# Patient Record
Sex: Female | Born: 1950 | Race: White | Hispanic: No | Smoking: Never smoker
Health system: Southern US, Community
[De-identification: ages and names within clinical notes are randomized; demographics above are authoritative.]

## PROBLEM LIST (undated history)

## (undated) DIAGNOSIS — C189 Malignant neoplasm of colon, unspecified: Secondary | ICD-10-CM

## (undated) DIAGNOSIS — I10 Essential (primary) hypertension: Secondary | ICD-10-CM

## (undated) DIAGNOSIS — T8859XA Other complications of anesthesia, initial encounter: Secondary | ICD-10-CM

## (undated) DIAGNOSIS — E119 Type 2 diabetes mellitus without complications: Secondary | ICD-10-CM

## (undated) DIAGNOSIS — R112 Nausea with vomiting, unspecified: Secondary | ICD-10-CM

## (undated) HISTORY — PX: APPENDECTOMY: SHX54

## (undated) HISTORY — PX: TOTAL HIP REVISION: SHX763

---

## 2014-11-21 ENCOUNTER — Encounter: Payer: Self-pay | Admitting: Emergency Medicine

## 2014-11-21 ENCOUNTER — Ambulatory Visit
Admission: EM | Admit: 2014-11-21 | Discharge: 2014-11-21 | Disposition: A | Discharge status: Home / Self Care | Payer: BC Managed Care – PPO | Attending: Family Medicine | Admitting: Family Medicine

## 2014-11-21 DIAGNOSIS — L03119 Cellulitis of unspecified part of limb: Secondary | ICD-10-CM

## 2014-11-21 HISTORY — DX: Type 2 diabetes mellitus without complications: E11.9

## 2014-11-21 HISTORY — DX: Malignant neoplasm of colon, unspecified: C18.9

## 2014-11-21 HISTORY — DX: Essential (primary) hypertension: I10

## 2014-11-21 MED ORDER — MUPIROCIN 2 % EX OINT: 1.0000 "application " | TOPICAL_OINTMENT | Freq: Three times a day (TID) | CUTANEOUS | Status: DC

## 2014-11-21 MED ORDER — SULFAMETHOXAZOLE-TRIMETHOPRIM 800-160 MG PO TABS: 1.0000 | ORAL_TABLET | Freq: Two times a day (BID) | ORAL | Status: DC

## 2014-11-21 NOTE — ED Notes (Signed)
Patient c/o possible insect bite to the top of her right foot yesterday.  Patient reports redness and tenderness at the site.  Patient also has a red streak going up her right foot and ankle. Patient denies fevers.

## 2014-11-21 NOTE — ED Provider Notes (Signed)
CSN: [id number]     Arrival date & time 11/21/14  1250 History   First MD Initiated Contact with Patient 11/21/14 1321     Chief Complaint  Patient presents with  . Cellulitis  . Insect Bite   (Consider location/radiation/quality/duration/timing/severity/associated sxs/prior Treatment) HPI   This a 64 year old female who presents with a bug bite on her right foot dorsum ;is  proximal to the fourth toe. She states she was out in her garden yesterday morning he felt a bite but did not actually see the bug. She rubbed her foot with a her other shoe. Later that night she felt flulike aches and chills and sweats and this morning noticed more swelling in the foot and also a red streak running up the dorsum of her foot to the base of her leg. She's not any fever or chills and is afebrile today in the clinic. She does have some clear drainage from the wound on top of her foot but no purulence is present. There is some erythema in this does extend up her leg.   Past Medical History  Diagnosis Date  . Diabetes mellitus without complication   . Hypertension   . Colon cancer    Past Surgical History  Procedure Laterality Date  . Cesarean section    . Appendectomy     History reviewed. No pertinent family history. Social History  Substance Use Topics  . Smoking status: Never Smoker   . Smokeless tobacco: Never Used  . Alcohol Use: No   OB History    No data available     Review of Systems  Constitutional: Positive for fatigue.  Skin: Positive for color change and wound.  All other systems reviewed and are negative.   Allergies  Latex; Lovenox; and Penicillins  Home Medications   Prior to Admission medications   Medication Sig Start Date End Date Taking? Authorizing Provider  aspirin 81 MG tablet Take 81 mg by mouth daily.   Yes Historical Provider, MD  glipiZIDE (GLUCOTROL XL) 5 MG 24 hr tablet Take 5 mg by mouth daily with breakfast.   Yes Historical Provider, MD   hydrochlorothiazide (HYDRODIURIL) 50 MG tablet Take 50 mg by mouth daily.   Yes Historical Provider, MD  metFORMIN (GLUCOPHAGE) 500 MG tablet Take 500 mg by mouth 2 (two) times daily with a meal.   Yes Historical Provider, MD  Multiple Vitamin (MULTIVITAMIN) tablet Take 1 tablet by mouth daily.   Yes Historical Provider, MD  omega-3 acid ethyl esters (LOVAZA) 1 G capsule Take 1 g by mouth 2 (two) times daily.   Yes Historical Provider, MD  telmisartan (MICARDIS) 40 MG tablet Take 40 mg by mouth daily.   Yes Historical Provider, MD  mupirocin ointment (BACTROBAN) 2 % Apply 1 application topically 3 (three) times daily. 11/21/14   Lorin Picket, PA-C  sulfamethoxazole-trimethoprim (BACTRIM DS,SEPTRA DS) 800-160 MG per tablet Take 1 tablet by mouth 2 (two) times daily. 11/21/14   Lorin Picket, PA-C   Meds Ordered and Administered this Visit  Medications - No data to display  BP 123/56 mmHg  Pulse 74  Temp(Src) 96.9 F (36.1 C) (Tympanic)  Resp 16  Ht 5\' 6"  (1.676 m)  Wt 248 lb (112.492 kg)  BMI 40.05 kg/m2  SpO2 98% No data found.   Physical Exam  Constitutional: She is oriented to person, place, and time. She appears well-developed and well-nourished.  HENT:  Head: Normocephalic and atraumatic.  Eyes: Pupils are equal, round, and  reactive to light.  Musculoskeletal: Normal range of motion. She exhibits edema and tenderness.  Neurological: She is alert and oriented to person, place, and time.  Skin: Skin is warm and dry. Rash noted. There is erythema.  Examination of the right foot shows a small ulceration over the dorsum of the foot distally at the base of the fourth toe. It is leaking clear fluid. There is surrounding erythema; extends proximally over the dorsum of her foot medially and up to the base of her leg near the ankle. It is blanchable. No purulence is seen. She has good range of motion of her toes and of her ankle.  Psychiatric: She has a normal mood and affect. Her  behavior is normal. Judgment and thought content normal.  Nursing note and vitals reviewed.   ED Course  Procedures (including critical care time)  Labs Review Labs Reviewed - No data to display  Imaging Review No results found.   Visual Acuity Review  Right Eye Distance:   Left Eye Distance:   Bilateral Distance:    Right Eye Near:   Left Eye Near:    Bilateral Near:         MDM   1. Cellulitis of foot    New Prescriptions   MUPIROCIN OINTMENT (BACTROBAN) 2 %    Apply 1 application topically 3 (three) times daily.   SULFAMETHOXAZOLE-TRIMETHOPRIM (BACTRIM DS,SEPTRA DS) 800-160 MG PER TABLET    Take 1 tablet by mouth 2 (two) times daily.  Plan: 1. Diagnosis reviewed with patient 2. rx as per orders; risks, benefits, potential side effects reviewed with patient 3. Recommend supportive treatment with elevation,warm compresses or soaks. 4. F/u prn if symptoms worsen or don't improve    Lorin Picket, PA-C 11/21/14 1410

## 2015-11-26 ENCOUNTER — Ambulatory Visit
Admission: EM | Admit: 2015-11-26 | Discharge: 2015-11-26 | Disposition: A | Discharge status: Home / Self Care | Payer: BC Managed Care – PPO | Attending: Family Medicine | Admitting: Family Medicine

## 2015-11-26 DIAGNOSIS — W57XXXA Bitten or stung by nonvenomous insect and other nonvenomous arthropods, initial encounter: Secondary | ICD-10-CM | POA: Diagnosis not present

## 2015-11-26 DIAGNOSIS — L03114 Cellulitis of left upper limb: Secondary | ICD-10-CM

## 2015-11-26 DIAGNOSIS — T148 Other injury of unspecified body region: Secondary | ICD-10-CM

## 2015-11-26 DIAGNOSIS — L089 Local infection of the skin and subcutaneous tissue, unspecified: Secondary | ICD-10-CM

## 2015-11-26 MED ORDER — DIPHENHYDRAMINE HCL 25 MG PO CAPS: 25.0000 mg | ORAL_CAPSULE | Freq: Three times a day (TID) | ORAL | 0 refills | Status: AC | PRN

## 2015-11-26 MED ORDER — ACETAMINOPHEN 500 MG PO TABS: 1000.0000 mg | ORAL_TABLET | Freq: Four times a day (QID) | ORAL | 0 refills | Status: AC | PRN

## 2015-11-26 MED ORDER — CEPHALEXIN 500 MG PO CAPS: 500.0000 mg | ORAL_CAPSULE | Freq: Four times a day (QID) | ORAL | 0 refills | Status: DC

## 2015-11-26 NOTE — ED Triage Notes (Signed)
Patient was working in her flower bed and was bit by a small bug and it has left a small bite mark and its swollen, red, and a red streak up her arm.

## 2015-11-26 NOTE — ED Provider Notes (Signed)
CSN: [id number]     Arrival date & time 11/26/15  1109 History   First MD Initiated Contact with Patient 11/26/15 1226     Chief Complaint  Patient presents with  . Insect Bite    Left Hand and Arm   (Consider location/radiation/quality/duration/timing/severity/associated sxs/prior Treatment) Single caucasian female here for possible ant bite.  Was gardening and got bitten on lateral left hand.  Swollen up and red despite benadryl and rest overnight.  Similar episode last year on her foot except that was oozing.  Denied tick bite/attachment.  Blood sugars her usual 110.  Denied fever.  Hand tender to touch swollen area and itching.  Noted streak of redness going up forearm left today.      Past Medical History:  Diagnosis Date  . Colon cancer ([place])   . Diabetes mellitus without complication ([place])   . Hypertension    Past Surgical History:  Procedure Laterality Date  . APPENDECTOMY    . CESAREAN SECTION    . TOTAL HIP REVISION     Right   History reviewed. No pertinent family history. Social History  Substance Use Topics  . Smoking status: Never Smoker  . Smokeless tobacco: Never Used  . Alcohol use No   OB History    No data available     Review of Systems  Constitutional: Negative for activity change, appetite change, chills, diaphoresis, fatigue and fever.  HENT: Negative for congestion, ear pain, sore throat, trouble swallowing and voice change.   Eyes: Negative for pain, discharge, itching and visual disturbance.  Respiratory: Negative for cough, shortness of breath, wheezing and stridor.   Cardiovascular: Negative for chest pain, palpitations and leg swelling.  Gastrointestinal: Negative for abdominal distention, abdominal pain, blood in stool, nausea and vomiting.  Endocrine: Negative for polydipsia, polyphagia and polyuria.  Genitourinary: Negative for difficulty urinating, dysuria and hematuria.  Musculoskeletal: Positive for myalgias. Negative for arthralgias,  back pain, gait problem, joint swelling, neck pain and neck stiffness.  Skin: Positive for color change and rash. Negative for pallor and wound.  Allergic/Immunologic: Negative for environmental allergies and food allergies.  Neurological: Negative for dizziness, tremors, seizures, syncope, facial asymmetry, speech difficulty, weakness, light-headedness, numbness and headaches.  Hematological: Negative for adenopathy. Does not bruise/bleed easily.  Psychiatric/Behavioral: Negative for sleep disturbance.  All other systems reviewed and are negative.   Allergies  Latex; Lovenox [enoxaparin]; and Penicillins  Home Medications   Prior to Admission medications   Medication Sig Start Date End Date Taking? Authorizing Provider  aspirin 81 MG tablet Take 81 mg by mouth daily.   Yes Historical Provider, MD  glipiZIDE (GLUCOTROL XL) 5 MG 24 hr tablet Take 5 mg by mouth daily with breakfast.   Yes Historical Provider, MD  hydrochlorothiazide (HYDRODIURIL) 50 MG tablet Take 50 mg by mouth daily.   Yes Historical Provider, MD  metFORMIN (GLUCOPHAGE) 500 MG tablet Take 500 mg by mouth 2 (two) times daily with a meal.   Yes Historical Provider, MD  Multiple Vitamin (MULTIVITAMIN) tablet Take 1 tablet by mouth daily.   Yes Historical Provider, MD  omega-3 acid ethyl esters (LOVAZA) 1 G capsule Take 1 g by mouth 2 (two) times daily.   Yes Historical Provider, MD  telmisartan (MICARDIS) 40 MG tablet Take 40 mg by mouth daily.   Yes Historical Provider, MD  acetaminophen (TYLENOL) 500 MG tablet Take 2 tablets (1,000 mg total) by mouth every 6 (six) hours as needed for mild pain or moderate pain. 11/26/15  11/29/15  Olen Cordial, NP  cephALEXin (KEFLEX) 500 MG capsule Take 1 capsule (500 mg total) by mouth 4 (four) times daily. 11/26/15   Olen Cordial, NP  diphenhydrAMINE (BENADRYL) 25 mg capsule Take 1 capsule (25 mg total) by mouth every 8 (eight) hours as needed for itching. 11/26/15   Olen Cordial, NP   mupirocin ointment (BACTROBAN) 2 % Apply 1 application topically 3 (three) times daily. 11/21/14   Lorin Picket, PA-C  sulfamethoxazole-trimethoprim (BACTRIM DS,SEPTRA DS) 800-160 MG per tablet Take 1 tablet by mouth 2 (two) times daily. 11/21/14   Lorin Picket, PA-C   Meds Ordered and Administered this Visit  Medications - No data to display  BP (!) 148/64 (BP Location: Right Arm)   Pulse (!) 57   Temp 98.1 F (36.7 C) (Oral)   Resp 18   Ht 5\' 6"  (1.676 m)   Wt 243 lb (110.2 kg)   SpO2 97%   BMI 39.22 kg/m  No data found.   Physical Exam  Constitutional: She is oriented to person, place, and time. She appears well-developed and well-nourished. She is active and cooperative.  Non-toxic appearance. She does not have a sickly appearance. She does not appear ill. No distress.  HENT:  Head: Normocephalic and atraumatic.  Right Ear: Hearing, external ear and ear canal normal.  Left Ear: Hearing, external ear and ear canal normal.  Nose: Nose normal.  Mouth/Throat: Uvula is midline, oropharynx is clear and moist and mucous membranes are normal. She does not have dentures. No oral lesions. No trismus in the jaw. Normal dentition. No dental abscesses, uvula swelling, lacerations or dental caries. No oropharyngeal exudate, posterior oropharyngeal edema, posterior oropharyngeal erythema or tonsillar abscesses.  Eyes: Conjunctivae, EOM and lids are normal. Pupils are equal, round, and reactive to light. Right eye exhibits no chemosis, no discharge, no exudate and no hordeolum. No foreign body present in the right eye. Left eye exhibits no chemosis, no discharge, no exudate and no hordeolum. No foreign body present in the left eye. Right conjunctiva is not injected. Right conjunctiva has no hemorrhage. Left conjunctiva is not injected. Left conjunctiva has no hemorrhage. No scleral icterus. Right eye exhibits normal extraocular motion and no nystagmus. Left eye exhibits normal extraocular  motion and no nystagmus.  Neck: Neck supple. No tracheal tenderness, no spinous process tenderness and no muscular tenderness present. No neck rigidity. No tracheal deviation, no edema, no erythema and normal range of motion present.  Cardiovascular: Normal rate, regular rhythm, S1 normal, S2 normal, normal heart sounds and intact distal pulses.   No murmur heard. Pulses:      Radial pulses are 2+ on the right side, and 2+ on the left side.  Pulmonary/Chest: Effort normal and breath sounds normal. No stridor. No respiratory distress. She has no decreased breath sounds. She has no wheezes. She has no rhonchi. She has no rales. She exhibits no tenderness.  Abdominal: Soft. She exhibits no distension. There is no tenderness. There is no guarding.  Musculoskeletal: Normal range of motion. She exhibits edema and tenderness. She exhibits no deformity.       Right shoulder: Normal.       Left shoulder: Normal.       Left elbow: Normal.       Left wrist: Normal.       Right hip: Normal.       Left hip: Normal.       Right knee: Normal.  Left knee: Normal.       Right ankle: Normal.       Left ankle: Normal.       Cervical back: Normal.       Thoracic back: Normal.       Lumbar back: Normal.       Right upper arm: Normal.       Left upper arm: Normal.       Right forearm: Normal.       Left forearm: She exhibits tenderness and swelling. She exhibits no bony tenderness, no edema, no deformity and no laceration.       Arms:      Right hand: Normal.       Left hand: She exhibits tenderness and swelling. She exhibits normal range of motion, no bony tenderness, normal two-point discrimination, normal capillary refill, no deformity and no laceration. Normal sensation noted. Normal strength noted.       Hands: Lymphadenopathy:       Head (right side): No submental, no submandibular, no tonsillar, no preauricular, no posterior auricular and no occipital adenopathy present.       Head (left side):  No submental, no submandibular, no tonsillar, no preauricular and no occipital adenopathy present.    She has no cervical adenopathy.       Right cervical: No superficial cervical, no deep cervical and no posterior cervical adenopathy present.      Left cervical: No superficial cervical, no deep cervical and no posterior cervical adenopathy present.    She has no axillary adenopathy.       Right axillary: No pectoral and no lateral adenopathy present.       Left axillary: No pectoral and no lateral adenopathy present. Neurological: She is alert and oriented to person, place, and time. She has normal strength. She is not disoriented. She displays no atrophy and no tremor. No cranial nerve deficit or sensory deficit. She exhibits normal muscle tone. She displays no seizure activity. Coordination and gait normal. GCS eye subscore is 4. GCS verbal subscore is 5. GCS motor subscore is 6.  Skin: Skin is warm and dry. Capillary refill takes less than 2 seconds. Rash noted. No abrasion, no bruising, no burn, no ecchymosis, no laceration, no lesion, no petechiae and no purpura noted. Rash is macular. Rash is not papular, not maculopapular, not nodular, not pustular, not vesicular and not urticarial. She is not diaphoretic. There is erythema. No cyanosis. No pallor. Nails show no clubbing.  Psychiatric: She has a normal mood and affect. Her speech is normal and behavior is normal. Judgment and thought content normal. She is not actively hallucinating. Cognition and memory are normal. She is attentive.  Nursing note and vitals reviewed.   Urgent Care Course   Clinical Course    Procedures (including critical care time)  Labs Review Labs Reviewed - No data to display  Imaging Review No results found.     MDM   1. Cellulitis of left upper extremity   2. Infected insect bite    Will treat for cellulitis possible bug bite to right hand.  Keflex 500mg  po TID x 7 days Rx given.  Discussed with  patient cephalosporins could have cross reactivity for penicillin allergy rash arm as child.  Contact clinic if new or worsening rash/dyspnea.  Exitcare handout on skin infection, insect bite given to patient.  RTC if worsening erythema, pain, purulent discharge, fever. cryotherapy 15 minutes TID/elevate hand/arm.  ER if worsening redness to shoulder may  require IV antibiotics.  Patient may take tylenol 1000mg  po QID prn pain.  Avoid scratching may use benadryl 25mg  po TID prn itching and/or topical benadryl e.g. Spray/gel.  Has bactroban ointment at home from foot infection last year may apply BID if open wound develops until healed.  Wash towels, washcloths, sheets in hot water with bleach every couple of days until infection resolved.  Patient verbalized understanding, agreed with plan of care and had no further questions at this time.      Olen Cordial, NP 11/26/15 [phone]

## 2017-01-30 ENCOUNTER — Other Ambulatory Visit: Payer: Self-pay

## 2017-01-30 ENCOUNTER — Encounter: Payer: Self-pay | Admitting: Emergency Medicine

## 2017-01-30 ENCOUNTER — Ambulatory Visit
Admission: EM | Admit: 2017-01-30 | Discharge: 2017-01-30 | Disposition: A | Discharge status: Home / Self Care | Payer: BC Managed Care – PPO | Attending: Family Medicine | Admitting: Family Medicine

## 2017-01-30 DIAGNOSIS — R21 Rash and other nonspecific skin eruption: Secondary | ICD-10-CM

## 2017-01-30 DIAGNOSIS — R233 Spontaneous ecchymoses: Secondary | ICD-10-CM

## 2017-01-30 LAB — CBC WITH DIFFERENTIAL/PLATELET
Basophils Absolute: 0.1 10*3/uL (ref 0–0.1)
Basophils Relative: 1 %
EOS ABS: 0 10*3/uL (ref 0–0.7)
EOS PCT: 0 %
HCT: 37.8 % (ref 35.0–47.0)
HEMOGLOBIN: 13 g/dL (ref 12.0–16.0)
LYMPHS ABS: 1 10*3/uL (ref 1.0–3.6)
Lymphocytes Relative: 17 %
MCH: 31.7 pg (ref 26.0–34.0)
MCHC: 34.4 g/dL (ref 32.0–36.0)
MCV: 92.1 fL (ref 80.0–100.0)
MONOS PCT: 12 %
Monocytes Absolute: 0.7 10*3/uL (ref 0.2–0.9)
NEUTROS PCT: 70 %
Neutro Abs: 4 10*3/uL (ref 1.4–6.5)
Platelets: 180 10*3/uL (ref 150–440)
RBC: 4.11 MIL/uL (ref 3.80–5.20)
RDW: 12.9 % (ref 11.5–14.5)
WBC: 5.7 10*3/uL (ref 3.6–11.0)

## 2017-01-30 LAB — COMPREHENSIVE METABOLIC PANEL
ALK PHOS: 80 U/L (ref 38–126)
ALT: 45 U/L (ref 14–54)
AST: 41 U/L (ref 15–41)
Albumin: 3.7 g/dL (ref 3.5–5.0)
Anion gap: 8 (ref 5–15)
BUN: 9 mg/dL (ref 6–20)
CALCIUM: 8.7 mg/dL — AB (ref 8.9–10.3)
CHLORIDE: 97 mmol/L — AB (ref 101–111)
CO2: 26 mmol/L (ref 22–32)
CREATININE: 0.48 mg/dL (ref 0.44–1.00)
Glucose, Bld: 161 mg/dL — ABNORMAL HIGH (ref 65–99)
Potassium: 3.4 mmol/L — ABNORMAL LOW (ref 3.5–5.1)
Sodium: 131 mmol/L — ABNORMAL LOW (ref 135–145)
TOTAL PROTEIN: 7.2 g/dL (ref 6.5–8.1)
Total Bilirubin: 1.4 mg/dL — ABNORMAL HIGH (ref 0.3–1.2)

## 2017-01-30 LAB — SEDIMENTATION RATE: SED RATE: 41 mm/h — AB (ref 0–30)

## 2017-01-30 NOTE — ED Triage Notes (Signed)
Patient in today c/o right LE edema and pain since last night. Patient does state that she had a reaction earlier in the week to the flu shot. Patient had chills, swells, fever and body aches that last for a day and a half.

## 2017-01-30 NOTE — ED Provider Notes (Addendum)
[facility]    CSN: [id number] Arrival date & time: 01/30/17  0820     History   Chief Complaint Chief Complaint  Patient presents with  . Leg Swelling   HPI  66 year old female presents for evaluation of right leg tenderness/redness.  Patient states that she recently received a flu vaccine this week.  On Tuesday she developed an influenza-like illness with fever, chills, sweats, body aches.  This subsequently resolved.  Last night she noted redness and tenderness of her right lower extremity.  She has had no further fever.  No known inciting event.  However, she is concerned that this is a reaction to vaccine.  No reports of injury or trauma.  She states that it was warm to the touch last night.  It has subsequently improved and is better as of today.  However, it continues to persist.  No reports of shortness of breath.  No known exacerbating relieving factors.  No other associated symptoms.  No other complaints at this time.  Past Medical History:  Diagnosis Date  . Colon cancer ([place])   . Diabetes mellitus without complication ([place])   . Hypertension    Past Surgical History:  Procedure Laterality Date  . APPENDECTOMY    . CESAREAN SECTION    . TOTAL HIP REVISION     Right   OB History    No data available     Home Medications    Prior to Admission medications   Medication Sig Start Date End Date Taking? Authorizing Provider  aspirin 81 MG tablet Take 81 mg by mouth daily.   Yes [provider]  glipiZIDE (GLUCOTROL XL) 5 MG 24 hr tablet Take 5 mg by mouth daily with breakfast.   Yes [provider]  hydrochlorothiazide (HYDRODIURIL) 50 MG tablet Take 50 mg by mouth daily.   Yes [provider]  metFORMIN (GLUCOPHAGE) 500 MG tablet Take 500 mg by mouth 2 (two) times daily with a meal.   Yes [provider]  Multiple Vitamin (MULTIVITAMIN) tablet Take 1 tablet by mouth daily.   Yes [provider]  omega-3 acid  ethyl esters (LOVAZA) 1 G capsule Take 1 g by mouth 2 (two) times daily.   Yes [provider]  telmisartan (MICARDIS) 40 MG tablet Take 40 mg by mouth daily.   Yes [provider]  cephALEXin (KEFLEX) 500 MG capsule Take 1 capsule (500 mg total) by mouth 4 (four) times daily. 11/26/15   Betancourt, Aura Fey, NP  diphenhydrAMINE (BENADRYL) 25 mg capsule Take 1 capsule (25 mg total) by mouth every 8 (eight) hours as needed for itching. 11/26/15   Betancourt, Aura Fey, NP  mupirocin ointment (BACTROBAN) 2 % Apply 1 application topically 3 (three) times daily. 11/21/14   Lorin Picket, PA-C  sulfamethoxazole-trimethoprim (BACTRIM DS,SEPTRA DS) 800-160 MG per tablet Take 1 tablet by mouth 2 (two) times daily. 11/21/14   Lorin Picket, PA-C   Family History Crohn's disease Brother    Osteoarthritis Brother    Alcohol abuse Father    Prostate cancer Father    Diabetes type II Mother    High blood pressure (Hypertension) Mother    Hyperlipidemia (Elevated cholesterol) Mother    Stroke Mother    Coronary Artery Disease (Blocked arteries around heart) Paternal Grandfather    Diabetes type II Paternal Grandfather    Stroke Paternal Grandmother    Obesity Son    Anesthesia problems Neg Hx     Social  History Social History   Tobacco Use  . Smoking status: Never Smoker  . Smokeless tobacco: Never Used  Substance Use Topics  . Alcohol use: No  . Drug use: No   Allergies   Latex; Lovenox [enoxaparin]; and Penicillins  Review of Systems Review of Systems  Constitutional:       Recent fever/body aches after receiving the flu vaccine.   Skin: Positive for rash.  All other systems reviewed and are negative.  Physical Exam Triage Vital Signs ED Triage Vitals  Enc Vitals Group     BP 01/30/17 0837 (!) 135/44     Pulse Rate 01/30/17 0837 73     Resp 01/30/17 0837 16     Temp 01/30/17 0837 98.2 F (36.8 C)     Temp Source 01/30/17 0837 Oral      SpO2 01/30/17 0837 97 %     Weight 01/30/17 0836 250 lb (113.4 kg)     Height 01/30/17 0836 5' 6"  (1.676 m)     Head Circumference --      Peak Flow --      Pain Score 01/30/17 0836 0     Pain Loc --      Pain Edu? --      Excl. in [place]? --    Updated Vital Signs BP (!) 135/44 (BP Location: Left Arm)   Pulse 73   Temp 98.2 F (36.8 C) (Oral)   Resp 16   Ht 5' 6"  (1.676 m)   Wt 250 lb (113.4 kg)   SpO2 97%   BMI 40.35 kg/m    Physical Exam  Constitutional: She is oriented to person, place, and time. She appears well-developed. No distress.  HENT:  Head: Normocephalic and atraumatic.  Eyes: Conjunctivae are normal. No scleral icterus.  Cardiovascular: Normal rate and regular rhythm.  No murmur heard. Pulmonary/Chest: Effort normal and breath sounds normal. No respiratory distress. She has no wheezes. She has no rales.  Abdominal: Soft. She exhibits no distension. There is no tenderness.  Neurological: She is alert and oriented to person, place, and time.  Skin:  R lower extremity with erythematous, petechial rash.  See image.   Psychiatric: She has a normal mood and affect. Her behavior is normal.  Vitals reviewed.    UC Treatments / Results  Labs (all labs ordered are listed, but only abnormal results are displayed) Labs Reviewed  SEDIMENTATION RATE - Abnormal; Notable for the following components:      Result Value   Sed Rate 41 (*)    All other components within normal limits  COMPREHENSIVE METABOLIC PANEL - Abnormal; Notable for the following components:   Sodium 131 (*)    Potassium 3.4 (*)    Chloride 97 (*)    Glucose, Bld 161 (*)    Calcium 8.7 (*)    Total Bilirubin 1.4 (*)    All other components within normal limits  CBC WITH DIFFERENTIAL/PLATELET   EKG  EKG Interpretation None      Radiology No results found.  Procedures Procedures (including critical care time)  Medications Ordered in UC Medications - No data to display   Initial  Impression / Assessment and Plan / UC Course  I have reviewed the triage vital signs and the nursing notes.  Pertinent labs & imaging results that were available during my care of the patient were reviewed by me and considered in my medical decision making (see chart for details).     66 year old female presents with  petechial rash.  Given the nature of her rash, there is concern about potential vasculitis.  Lab work was obtained and was only notable for elevated ESR of 41.  No thrombocytopenia.  No white count.  The etiology and prognosis is unclear at this time. She is well-appearing.  This does not appear to be infectious in nature.  No antibiotics.  Advised close monitoring, elevation.  She needs close follow-up with her primary.  Advised that if she worsens, she should go to the hospital.  Final Clinical Impressions(s) / UC Diagnoses   Final diagnoses:  Petechial rash   ED Discharge Orders    None     Controlled Substance Prescriptions [place] Controlled Substance Registry consulted? Not Applicable   Coral Spikes, DO 01/30/17 1018    Thersa Salt G, DO 01/30/17 1018

## 2017-01-30 NOTE — Discharge Instructions (Signed)
Keep an eye on the area.  If it worsens, go to the hospital.  Elevate as much as possible.  Take care  Dr. Lacinda Axon

## 2020-03-20 ENCOUNTER — Ambulatory Visit
Admission: EM | Admit: 2020-03-20 | Discharge: 2020-03-20 | Disposition: A | Discharge status: Home / Self Care | Payer: BC Managed Care – PPO

## 2020-03-20 ENCOUNTER — Other Ambulatory Visit: Payer: Self-pay

## 2020-03-20 DIAGNOSIS — R6884 Jaw pain: Secondary | ICD-10-CM

## 2020-03-20 NOTE — Discharge Instructions (Addendum)
Take the 600 mg ibuprofen that you were prescribed after your dental surgery every 6 hours with food as needed for pain.  Continue to apply warm compresses to your left lower jaw to improve blood flow to the area and help decrease pain and stiffness.  I would recommend eating only soft foods for the next few days until your pain improves.  If your pain does not improve I recommend following up with either your dentist or your oral surgeon for a [facility] x-ray of your jaw.
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
## 2020-03-20 NOTE — ED Notes (Signed)
No answer when pt phone number called for room assignment. L/m to call [facility]

## 2020-05-31 ENCOUNTER — Encounter: Payer: Self-pay | Admitting: Emergency Medicine

## 2020-05-31 ENCOUNTER — Other Ambulatory Visit: Payer: Self-pay

## 2020-05-31 ENCOUNTER — Ambulatory Visit (INDEPENDENT_AMBULATORY_CARE_PROVIDER_SITE_OTHER): Payer: Medicare PPO

## 2020-05-31 ENCOUNTER — Ambulatory Visit
Admission: EM | Admit: 2020-05-31 | Discharge: 2020-05-31 | Disposition: A | Discharge status: Home / Self Care | Payer: Medicare PPO

## 2020-05-31 DIAGNOSIS — M25461 Effusion, right knee: Secondary | ICD-10-CM

## 2020-05-31 DIAGNOSIS — S8000XA Contusion of unspecified knee, initial encounter: Secondary | ICD-10-CM

## 2020-05-31 DIAGNOSIS — M25462 Effusion, left knee: Secondary | ICD-10-CM | POA: Diagnosis not present

## 2020-05-31 DIAGNOSIS — W010XXA Fall on same level from slipping, tripping and stumbling without subsequent striking against object, initial encounter: Secondary | ICD-10-CM

## 2020-05-31 MED ORDER — MUPIROCIN 2 % EX OINT: 1.0000 "application " | TOPICAL_OINTMENT | Freq: Two times a day (BID) | CUTANEOUS | 0 refills | Status: AC

## 2020-05-31 NOTE — Discharge Instructions (Addendum)
Ice areas of pain for 20 minutes 3-5 times a day for 48 hours.  Follow up with your primary care doctor next week.

## 2020-05-31 NOTE — ED Provider Notes (Signed)
[facility]    CSN: [id number] Arrival date & time: 05/31/20  1433      History   Chief Complaint Chief Complaint  Patient presents with  . Fall    DOI 05/31/20  . Knee Pain    bilateral    HPI Carol Tran is a 70 y.o. female who presents with bilateral knee pain today since she fell this am as she was walking to have a pedicure. She was wearing flip flops and tripped on the landing step, landing on both her knees. Her L one hurts more than the R one. She was able to get up and walk and went home and has been applying ice. Ad the day went on, her pain and stiffness is getting worse. Has been having to use an old cane to help her walk.     Past Medical History:  Diagnosis Date  . Colon cancer ([place])   . Diabetes mellitus without complication ([place])   . Hypertension     There are no problems to display for this patient.   Past Surgical History:  Procedure Laterality Date  . APPENDECTOMY    . CESAREAN SECTION    . TOTAL HIP REVISION     Right    OB History   No obstetric history on file.      Home Medications    Prior to Admission medications   Medication Sig Start Date End Date Taking? Authorizing Provider  diphenhydrAMINE (BENADRYL) 25 mg capsule Take 1 capsule (25 mg total) by mouth every 8 (eight) hours as needed for itching. 11/26/15  Yes Betancourt, Aura Fey, NP  glipiZIDE (GLUCOTROL XL) 5 MG 24 hr tablet Take 5 mg by mouth daily with breakfast.   Yes [provider]  metFORMIN (GLUCOPHAGE) 500 MG tablet Take 500 mg by mouth 2 (two) times daily with a meal.   Yes [provider]  Multiple Vitamin (MULTIVITAMIN) tablet Take 1 tablet by mouth daily.   Yes [provider]  mupirocin ointment (BACTROBAN) 2 % Apply 1 application topically 2 (two) times daily. X 5-7 days 05/31/20  Yes Rodriguez-Southworth, Sunday Spillers, PA-C  simvastatin (ZOCOR) 10 MG tablet Take 1 tablet by mouth every other day. 02/15/20  Yes [provider]   telmisartan (MICARDIS) 80 MG tablet Take 80 mg by mouth daily. 03/27/20  Yes [provider]  telmisartan (MICARDIS) 40 MG tablet Take by mouth. 08/25/19 08/24/20  [provider]  hydrochlorothiazide (HYDRODIURIL) 50 MG tablet Take 50 mg by mouth daily.  05/31/20  [provider]  omega-3 acid ethyl esters (LOVAZA) 1 G capsule Take 1 g by mouth 2 (two) times daily.  05/31/20  [provider]    Family History Family History  Problem Relation Age of Onset  . Stroke Mother   . Diabetes Mother   . Prostate cancer Father     Social History Social History   Tobacco Use  . Smoking status: Never Smoker  . Smokeless tobacco: Never Used  Vaping Use  . Vaping Use: Never used  Substance Use Topics  . Alcohol use: No  . Drug use: No     Allergies   Enoxaparin, Penicillins, Doxycycline, Other, and Latex   Review of Systems Review of Systems  Musculoskeletal: Positive for arthralgias and joint swelling. Negative for gait problem.  Skin: Positive for wound. Negative for color change and rash.       Has superficial abrasions on knees   Neurological: Negative for dizziness and  syncope.     Physical Exam Triage Vital Signs ED Triage Vitals  Enc Vitals Group     BP 05/31/20 1444 (!) 187/78     Pulse Rate 05/31/20 1444 71     Resp 05/31/20 1444 18     Temp 05/31/20 1444 98.5 F (36.9 C)     Temp Source 05/31/20 1444 Oral     SpO2 05/31/20 1444 99 %     Weight 05/31/20 1443 240 lb (108.9 kg)     Height 05/31/20 1443 5\' 6"  (1.676 m)     Head Circumference --      Peak Flow --      Pain Score 05/31/20 1443 7     Pain Loc --      Pain Edu? --      Excl. in [place]? --    No data found.  Updated Vital Signs BP (!) 187/78 (BP Location: Left Arm)   Pulse 71   Temp 98.5 F (36.9 C) (Oral)   Resp 18   Ht 5\' 6"  (1.676 m)   Wt 240 lb (108.9 kg)   SpO2 99%   BMI 38.74 kg/m   Visual Acuity Right Eye Distance:   Left Eye Distance:   Bilateral  Distance:    Right Eye Near:   Left Eye Near:    Bilateral Near:     Physical Exam Vitals and nursing note reviewed.  Constitutional:      General: She is not in acute distress.    Appearance: She is obese. She is not toxic-appearing.  HENT:     Head: Normocephalic.     Right Ear: External ear normal.     Left Ear: External ear normal.  Eyes:     General: No scleral icterus.    Conjunctiva/sclera: Conjunctivae normal.  Pulmonary:     Effort: Pulmonary effort is normal.  Musculoskeletal:     Cervical back: Neck supple.     Comments: L KNEE- with large superficial abrasion, not bleeding and there is no ecchymosis. Does have moderate swelling on the L of her patella. ROM provokes pain, but is able to bare weight.   Skin:    General: Skin is warm and dry.     Findings: No bruising.     Comments: R KNEE- with faint superficial abrasion, ROM is normal. There is no ecchymosis or swelling.   Neurological:     Mental Status: She is alert and oriented to person, place, and time.     Comments: Uses a cane to walk, and gait is slow, but normal   Psychiatric:        Mood and Affect: Mood normal.        Behavior: Behavior normal.        Thought Content: Thought content normal.        Judgment: Judgment normal.      UC Treatments / Results  Labs (all labs ordered are listed, but only abnormal results are displayed) Labs Reviewed - No data to display  EKG   Radiology DG Knee Complete 4 Views Left  Result Date: 05/31/2020 CLINICAL DATA:  Bilateral knee pain after fall on concrete today. EXAM: LEFT KNEE - COMPLETE 4+ VIEW COMPARISON:  None. FINDINGS: No fracture or dislocation. Mild tricompartmental osteoarthritis with tricompartmental peripheral spurring. Mild medial tibiofemoral joint space narrowing. There is a small knee joint effusion. Mild anterior soft tissue edema. Tiny quadriceps tendon enthesophyte. IMPRESSION: 1. No acute fracture or dislocation of the left knee. 2.  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 Electronically Signed   By: Keith Rake M.D.   On: 05/31/2020 15:50   DG Knee Complete 4 Views Right  Result Date: 05/31/2020 CLINICAL DATA:  Bilateral knee pain after fall on concrete today. EXAM: RIGHT KNEE - COMPLETE 4+ VIEW COMPARISON:  None. FINDINGS: No fracture or dislocation. 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 Medial tibiofemoral joint space narrowing. Moderate tricompartmental peripheral spurring and spurring of the tibial spines. There is a small to moderate knee joint effusion. Tiny quadriceps tendon enthesophyte. IMPRESSION: 1. No acute fracture or dislocation of the right knee. 2. 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 Electronically Signed   By: Keith Rake M.D.   On: 05/31/2020 15:49    Procedures Procedures (including critical care time)  Medications Ordered in UC Medications - No data to display  Initial Impression / Assessment and Plan / UC Course  I have reviewed the triage vital signs and the nursing notes. Pertinent  imaging results that were available during my care of the patient were reviewed by me and considered in my medical decision making (see chart for details). I prescribed her antibiotic ointment to apply on the abrasion. See instructions.  Final Clinical Impressions(s) / UC Diagnoses   Final diagnoses:  Contusion of knee, unspecified laterality, initial encounter     Discharge Instructions     Ice areas of pain for 20 minutes 3-5 times a day for 48 hours.  Follow up with your primary care doctor next week.     ED Prescriptions    Medication Sig Dispense Auth. Provider   mupirocin ointment (BACTROBAN) 2 % Apply 1 application topically 2 (two) times daily. X 5-7 days 22 g Rodriguez-Southworth, Sunday Spillers, PA-C     PDMP not reviewed this encounter.   Shelby Mattocks, PA-C 05/31/20 1559

## 2020-05-31 NOTE — ED Triage Notes (Signed)
Patient in today c/o bilateral knee pain after falling on concrete today. Patient states she had flip flops on and missed the step up to go into a pedicure business and fell forward and landed on her knees. Patient did take OTC Tylenol ~ 12:00pm and applied ice to her knees.

## 2021-12-18 IMAGING — CR DG KNEE COMPLETE 4+V*R*
5 series · 5 of 5 positions shown · non-contrast
Comparison: None.

CLINICAL DATA: Bilateral knee pain after fall on concrete today.

EXAM:
RIGHT KNEE - COMPLETE 4+ VIEW

[knee ap]
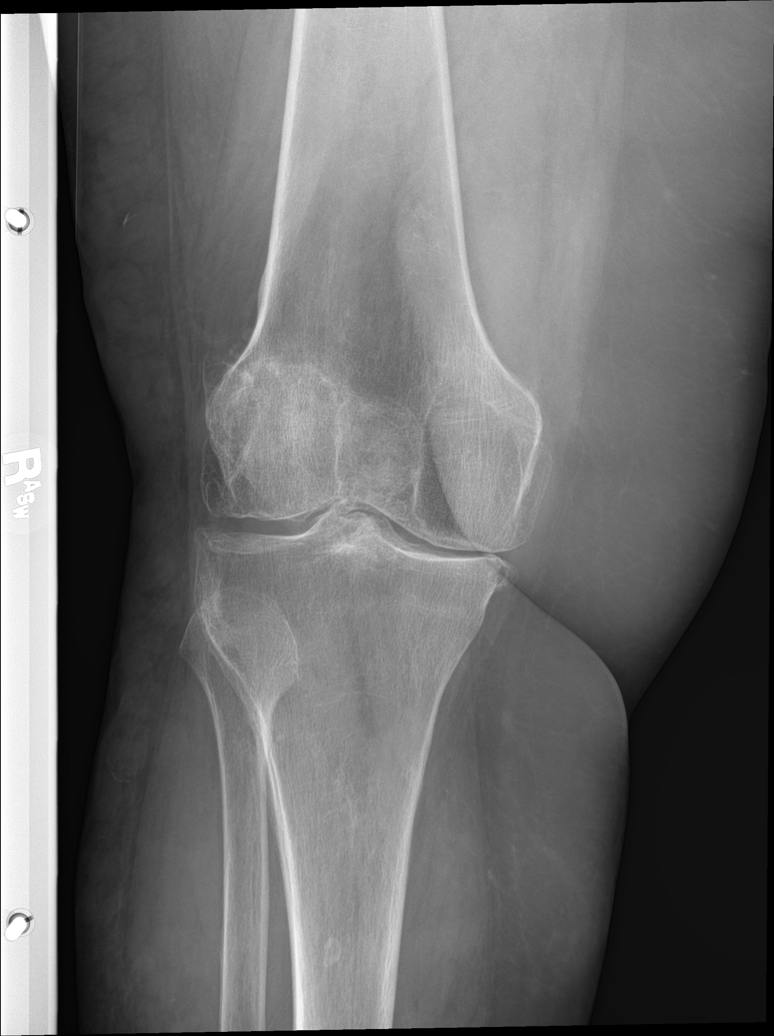

[knee lat]
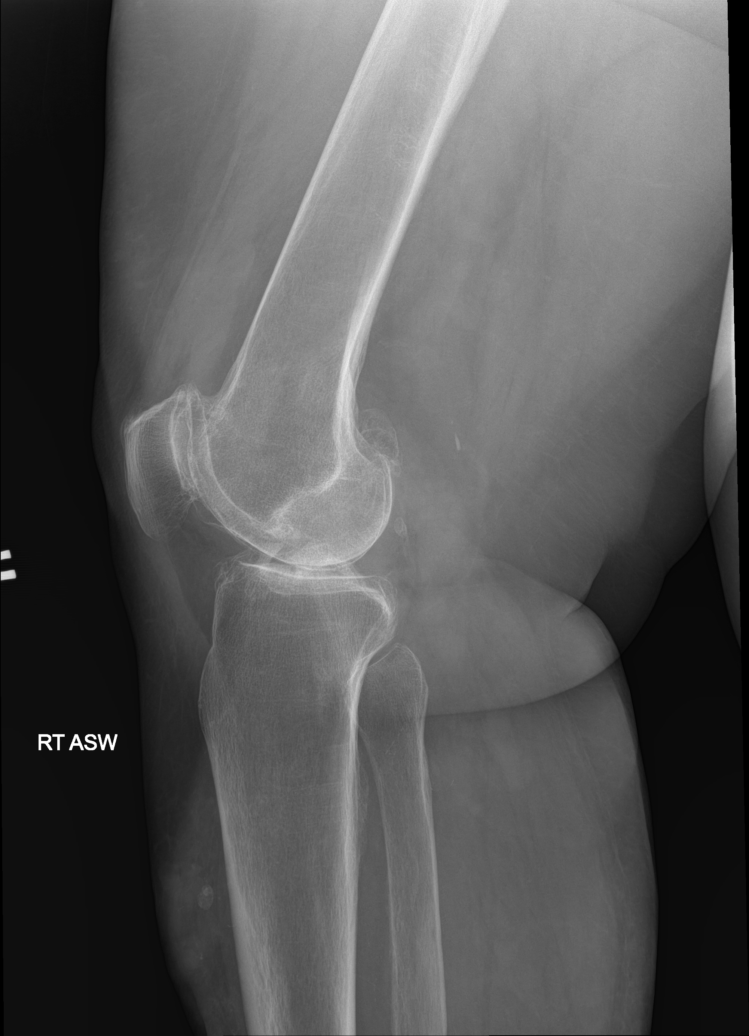

[knee obl (1 of 3)]
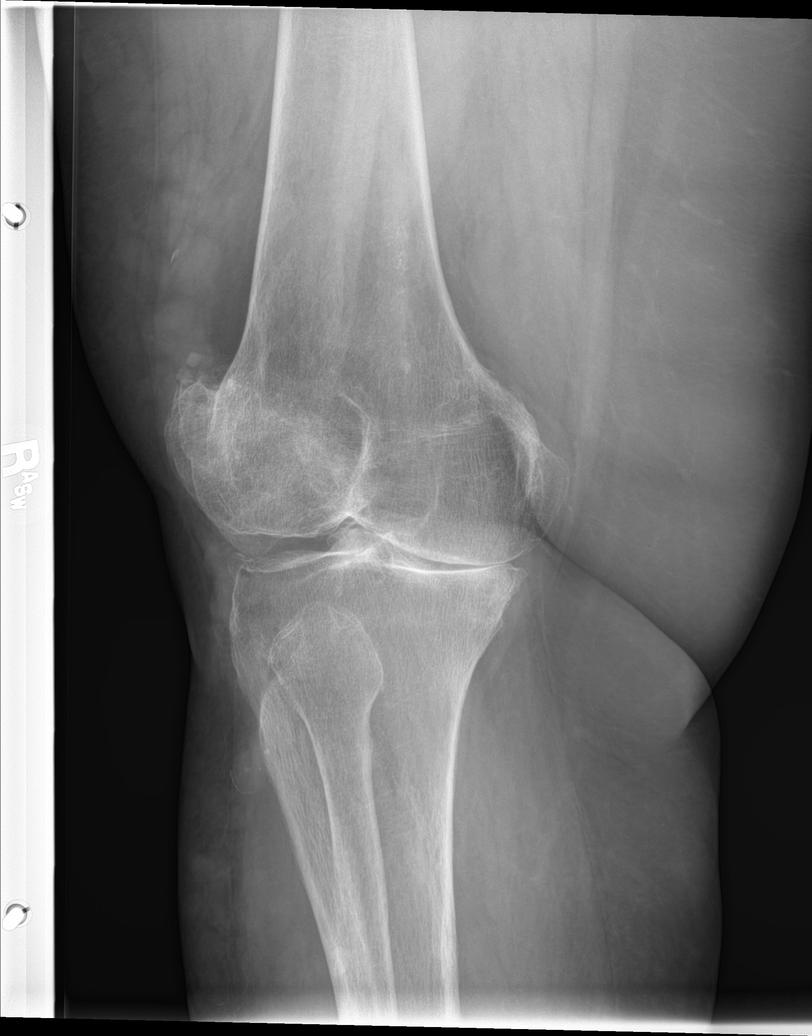

[knee obl (2 of 3)]
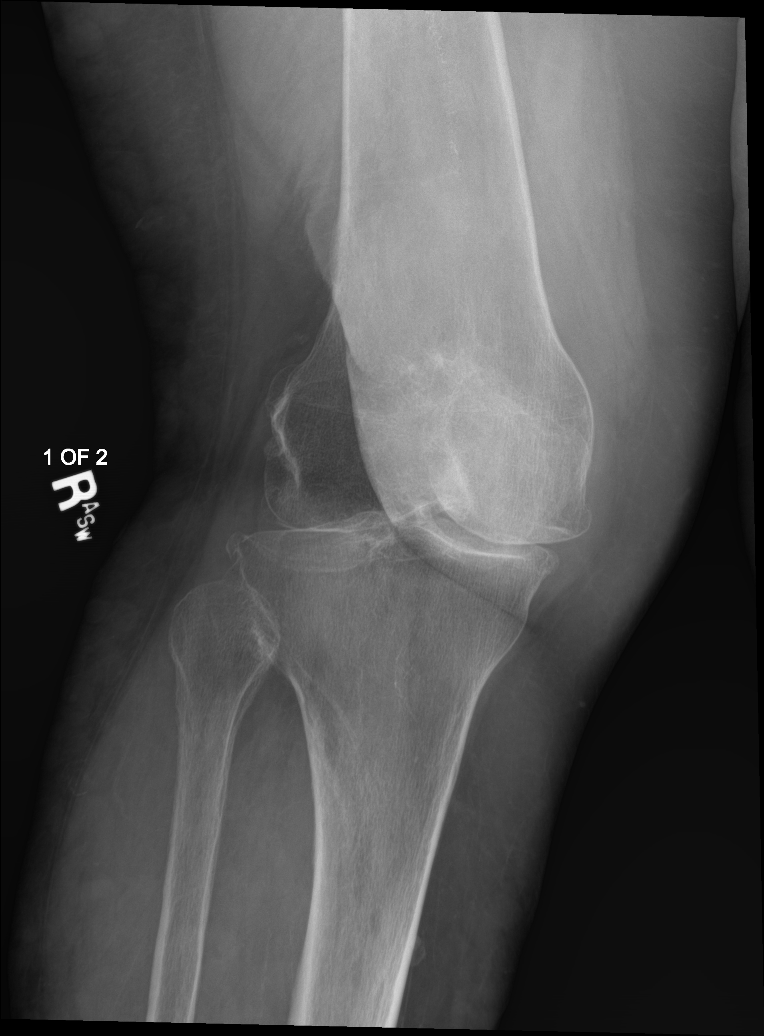

[knee obl (3 of 3)]
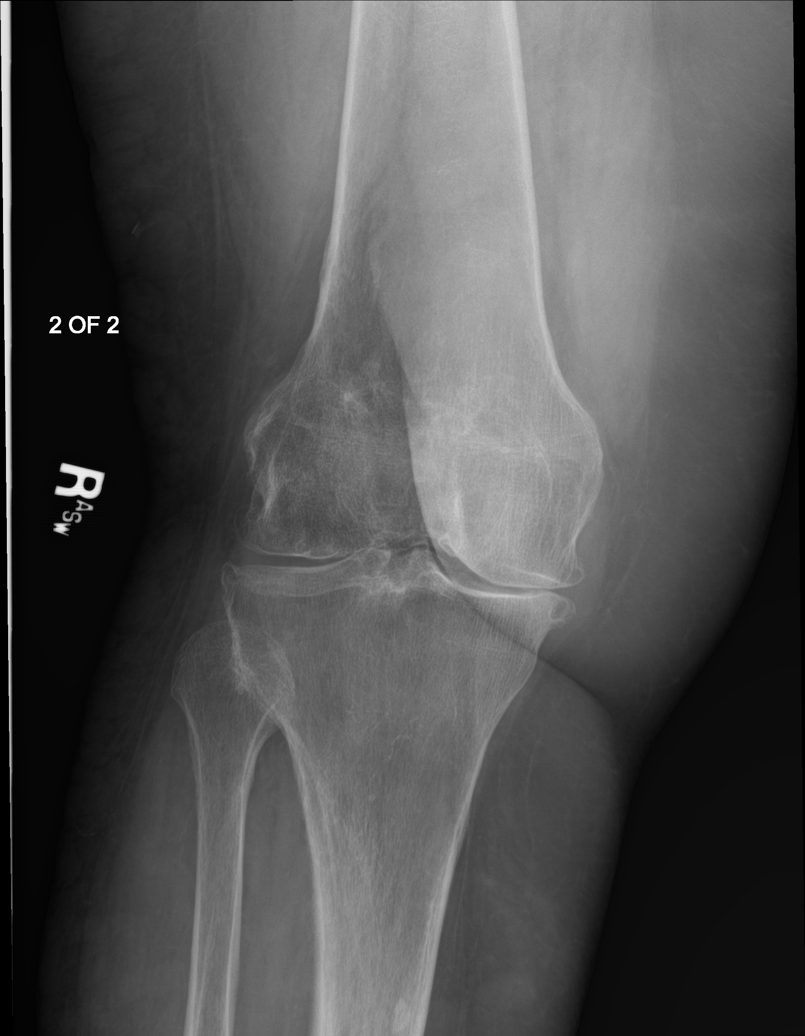

[5 of 5 positions shown; findings below may reference images not displayed]

FINDINGS: No fracture or dislocation. Moderate to advanced tricompartmental
osteoarthritis. Medial tibiofemoral joint space narrowing. Moderate
tricompartmental peripheral spurring and spurring of the tibial
spines. There is a small to moderate knee joint effusion. Tiny
quadriceps tendon enthesophyte.
IMPRESSION: 1. No acute fracture or dislocation of the right knee.
2. Moderate to advanced tricompartmental osteoarthritis with small
to moderate knee joint effusion.

## 2023-08-25 ENCOUNTER — Ambulatory Visit: Admission: EM | Admit: 2023-08-25 | Discharge: 2023-08-25 | Disposition: A | Discharge status: Home / Self Care

## 2023-08-25 DIAGNOSIS — B9789 Other viral agents as the cause of diseases classified elsewhere: Secondary | ICD-10-CM | POA: Diagnosis not present

## 2023-08-25 DIAGNOSIS — J069 Acute upper respiratory infection, unspecified: Secondary | ICD-10-CM | POA: Diagnosis not present

## 2023-08-25 DIAGNOSIS — R051 Acute cough: Secondary | ICD-10-CM

## 2023-08-25 DIAGNOSIS — J029 Acute pharyngitis, unspecified: Secondary | ICD-10-CM | POA: Diagnosis present

## 2023-08-25 LAB — GROUP A STREP BY PCR: Group A Strep by PCR: NOT DETECTED

## 2023-08-25 MED ORDER — PROMETHAZINE-DM 6.25-15 MG/5ML PO SYRP: 5.0000 mL | ORAL_SOLUTION | Freq: Four times a day (QID) | ORAL | 0 refills | Status: AC | PRN

## 2023-08-25 MED ORDER — LIDOCAINE VISCOUS HCL 2 % MT SOLN: 15.0000 mL | OROMUCOSAL | 0 refills | Status: AC | PRN

## 2023-08-25 MED ORDER — IPRATROPIUM BROMIDE 0.06 % NA SOLN: 2.0000 | Freq: Four times a day (QID) | NASAL | 0 refills | Status: AC

## 2023-08-25 NOTE — ED Provider Notes (Signed)
 [facility]    CSN: [id number] Arrival date & time: 08/25/23  8119      History   Chief Complaint Chief Complaint  Patient presents with   Sore Throat   Cough    HPI Carol Tran is a 73 y.o. female presenting for cough, congestion, sore throat, and throat tightness x 5 days.  Denies fever but has had some chills.  No complaint of sinus pain, chest pain, shortness of breath, abdominal pain, vomiting or diarrhea.  No report of any sick contacts.  Has taken a COVID test at home twice and it has been negative.  She says the sore throat has gotten worse today.  She has been taking Tylenol  but no other medications.  No other complaints.  HPI  Past Medical History:  Diagnosis Date   Colon cancer (HCC)    Diabetes mellitus without complication (HCC)    Hypertension     There are no active problems to display for this patient.   Past Surgical History:  Procedure Laterality Date   APPENDECTOMY     CESAREAN SECTION     TOTAL HIP REVISION     Right    OB History   No obstetric history on file.      Home Medications    Prior to Admission medications   Medication Sig Start Date End Date Taking? Authorizing Provider  ipratropium (ATROVENT) 0.06 % nasal spray Place 2 sprays into both nostrils 4 (four) times daily. 08/25/23  Yes Nancy Axon B, PA-C  lidocaine (XYLOCAINE) 2 % solution Use as directed 15 mLs in the mouth or throat every 3 (three) hours as needed for mouth pain (swish and spit). 08/25/23  Yes Floydene Hy, PA-C  promethazine-dextromethorphan (PROMETHAZINE-DM) 6.25-15 MG/5ML syrup Take 5 mLs by mouth 4 (four) times daily as needed. 08/25/23  Yes Floydene Hy, PA-C  diphenhydrAMINE  (BENADRYL ) 25 mg capsule Take 1 capsule (25 mg total) by mouth every 8 (eight) hours as needed for itching. 11/26/15  Yes Betancourt, Cleotis Daily, NP  glipiZIDE (GLUCOTROL XL) 5 MG 24 hr tablet Take 5 mg by mouth daily with breakfast.   Yes [provider]  metFORMIN  (GLUCOPHAGE) 500 MG tablet Take 500 mg by mouth 2 (two) times daily with a meal.   Yes [provider]  metoprolol succinate (TOPROL-XL) 25 MG 24 hr tablet Take 25 mg by mouth daily.   Yes [provider]  Multiple Vitamin (MULTIVITAMIN) tablet Take 1 tablet by mouth daily.   Yes [provider]  mupirocin  ointment (BACTROBAN ) 2 % Apply 1 application topically 2 (two) times daily. X 5-7 days 05/31/20   Rodriguez-Southworth, Sylvia, PA-C  simvastatin (ZOCOR) 10 MG tablet Take 1 tablet by mouth every other day. 02/15/20  Yes [provider]  telmisartan (MICARDIS) 40 MG tablet Take by mouth. 08/25/19 08/25/23 Yes [provider]  telmisartan (MICARDIS) 80 MG tablet Take 80 mg by mouth daily. 03/27/20  Yes [provider]  hydrochlorothiazide (HYDRODIURIL) 50 MG tablet Take 50 mg by mouth daily.  05/31/20  [provider]  omega-3 acid ethyl esters (LOVAZA) 1 G capsule Take 1 g by mouth 2 (two) times daily.  05/31/20  [provider]    Family History Family History  Problem Relation Age of Onset   Stroke Mother    Diabetes Mother    Prostate cancer Father     Social History Social History   Tobacco Use   Smoking status: Never  Smokeless tobacco: Never  Vaping Use   Vaping status: Never Used  Substance Use Topics   Alcohol use: No   Drug use: No     Allergies   Enoxaparin, Penicillins, Doxycycline, Other, and Latex   Review of Systems Review of Systems  Constitutional:  Positive for chills. Negative for diaphoresis, fatigue and fever.  HENT:  Positive for congestion, rhinorrhea and sore throat. Negative for ear pain, sinus pressure and sinus pain.   Respiratory:  Positive for cough. Negative for shortness of breath.   Cardiovascular:  Negative for chest pain.  Gastrointestinal:  Negative for abdominal pain, nausea and vomiting.  Musculoskeletal:  Negative for arthralgias and myalgias.  Skin:  Negative for rash.   Neurological:  Negative for weakness and headaches.  Hematological:  Negative for adenopathy.     Physical Exam Triage Vital Signs ED Triage Vitals  Encounter Vitals Group     BP      Systolic BP Percentile      Diastolic BP Percentile      Pulse      Resp      Temp      Temp src      SpO2      Weight      Height      Head Circumference      Peak Flow      Pain Score      Pain Loc      Pain Education      Exclude from Growth Chart    No data found.  Updated Vital Signs BP (!) 185/82 (BP Location: Right Arm)   Pulse 74   Temp 98.2 F (36.8 C) (Oral)   Resp 16   SpO2 95%    Physical Exam Vitals and nursing note reviewed.  Constitutional:      General: She is not in acute distress.    Appearance: Normal appearance. She is not ill-appearing or toxic-appearing.  HENT:     Head: Normocephalic and atraumatic.     Nose: Congestion present.     Mouth/Throat:     Mouth: Mucous membranes are moist.     Pharynx: Oropharynx is clear. Posterior oropharyngeal erythema (very mild with PND) present.  Eyes:     General: No scleral icterus.       Right eye: No discharge.        Left eye: No discharge.     Conjunctiva/sclera: Conjunctivae normal.  Cardiovascular:     Rate and Rhythm: Normal rate and regular rhythm.     Heart sounds: Normal heart sounds.  Pulmonary:     Effort: Pulmonary effort is normal. No respiratory distress.     Breath sounds: Normal breath sounds.  Musculoskeletal:     Cervical back: Neck supple.  Skin:    General: Skin is dry.  Neurological:     General: No focal deficit present.     Mental Status: She is alert. Mental status is at baseline.     Motor: No weakness.     Gait: Gait normal.  Psychiatric:        Mood and Affect: Mood normal.        Behavior: Behavior normal.      UC Treatments / Results  Labs (all labs ordered are listed, but only abnormal results are displayed) Labs Reviewed  GROUP A STREP BY PCR     EKG   Radiology No results found.  Procedures Procedures (including critical care time)  Medications Ordered in UC  Medications - No data to display  Initial Impression / Assessment and Plan / UC Course  I have reviewed the triage vital signs and the nursing notes.  Pertinent labs & imaging results that were available during my care of the patient were reviewed by me and considered in my medical decision making (see chart for details).   73 y/o female presents for sore throat, cough, and congestion x 5 days. No fever, sinus pain or SOB.  She is afebrile and overall well appearing. NAD. On exam has mild nasal congestion, mild erythema of posterior pharynx with clear PND. Chest clear. Heart RRR.  PCR strep obtained. Negative.   Viral URI. Sent promethazine DM, viscous lidocaine, and Atrovent nasal spray. Reviewed typical course of most viral illnesses. Reviewed return and ED precautions.   Final Clinical Impressions(s) / UC Diagnoses   Final diagnoses:  Viral upper respiratory tract infection  Sore throat  Acute cough     Discharge Instructions      URI/COLD SYMPTOMS: Your exam today is consistent with a viral illness. Antibiotics are not indicated at this time. Use medications as directed, including cough syrup, nasal saline, and decongestants. Your symptoms should improve over the next few days and resolve within 7-10 days. Increase rest and fluids. F/u if symptoms worsen or predominate such as sore throat, ear pain, productive cough, shortness of breath, or if you develop high fevers or worsening fatigue over the next several days.     ED Prescriptions     Medication Sig Dispense Auth. Provider   promethazine-dextromethorphan (PROMETHAZINE-DM) 6.25-15 MG/5ML syrup Take 5 mLs by mouth 4 (four) times daily as needed. 118 mL Nancy Axon B, PA-C   ipratropium (ATROVENT) 0.06 % nasal spray Place 2 sprays into both nostrils 4 (four) times daily. 15 mL Nancy Axon B,  PA-C   lidocaine (XYLOCAINE) 2 % solution Use as directed 15 mLs in the mouth or throat every 3 (three) hours as needed for mouth pain (swish and spit). 100 mL Floydene Hy, PA-C      PDMP not reviewed this encounter.   Floydene Hy, PA-C 08/25/23 [phone]

## 2023-08-25 NOTE — ED Triage Notes (Signed)
 Pt presents with a cough and sore throat x 5 days. Pt has taken tylenol  for her symtpoms

## 2023-08-25 NOTE — Discharge Instructions (Addendum)
URI/COLD SYMPTOMS:Strep negative. Your exam today is consistent with a viral illness. Antibiotics are not indicated at this time. Use medications as directed, including cough syrup, nasal saline, and decongestants. Your symptoms should improve over the next few days and resolve within 7-10 days. Increase rest and fluids. F/u if symptoms worsen or predominate such as sore throat, ear pain, productive cough, shortness of breath, or if you develop high fevers or worsening fatigue over the next several days.   ?

## 2023-10-12 ENCOUNTER — Encounter: Payer: Self-pay | Admitting: Ophthalmology

## 2023-10-12 NOTE — Anesthesia Preprocedure Evaluation (Addendum)
 Anesthesia Evaluation  Patient identified by MRN, date of birth, ID band Patient awake    Reviewed: Allergy & Precautions, H&P , NPO status , Patient's Chart, lab work & pertinent test results  History of Anesthesia Complications (+) PONV and history of anesthetic complications  Airway Mallampati: III  TM Distance: >3 FB Neck ROM: Full    Dental no notable dental hx.  Lots of implants, cap, crowns but not in front, all in back:   Pulmonary neg pulmonary ROS   Pulmonary exam normal breath sounds clear to auscultation       Cardiovascular hypertension, negative cardio ROS Normal cardiovascular exam Rhythm:Regular Rate:Normal     Neuro/Psych negative neurological ROS  negative psych ROS   GI/Hepatic negative GI ROS, Neg liver ROS,,,  Endo/Other  negative endocrine ROSdiabetes    Renal/GU negative Renal ROS  negative genitourinary   Musculoskeletal negative musculoskeletal ROS (+)    Abdominal   Peds negative pediatric ROS (+)  Hematology negative hematology ROS (+)   Anesthesia Other Findings   Medical History  Diabetes mellitus without complication (HCC) Hypertension Colon cancer (HCC)     Reproductive/Obstetrics negative OB ROS                              Anesthesia Physical Anesthesia Plan  ASA: 3  Anesthesia Plan:    Post-op Pain Management:    Induction:   PONV Risk Score and Plan:   Airway Management Planned:   Additional Equipment:   Intra-op Plan:   Post-operative Plan:   Informed Consent:   Plan Discussed with:   Anesthesia Plan Comments:         Anesthesia Quick Evaluation

## 2023-10-15 NOTE — Discharge Instructions (Signed)

## 2023-10-19 ENCOUNTER — Encounter: Payer: Self-pay | Admitting: Ophthalmology

## 2023-10-19 ENCOUNTER — Ambulatory Visit
Admission: RE | Admit: 2023-10-19 | Discharge: 2023-10-19 | Disposition: A | Discharge status: Home / Self Care | Attending: Ophthalmology | Admitting: Ophthalmology

## 2023-10-19 ENCOUNTER — Other Ambulatory Visit: Payer: Self-pay

## 2023-10-19 ENCOUNTER — Ambulatory Visit: Payer: Self-pay | Admitting: Anesthesiology

## 2023-10-19 ENCOUNTER — Encounter
Admission: RE | Disposition: A | Discharge status: Home / Self Care | Payer: Self-pay | Source: Home / Self Care | Attending: Ophthalmology

## 2023-10-19 DIAGNOSIS — H2511 Age-related nuclear cataract, right eye: Secondary | ICD-10-CM | POA: Diagnosis present

## 2023-10-19 DIAGNOSIS — I10 Essential (primary) hypertension: Secondary | ICD-10-CM | POA: Diagnosis not present

## 2023-10-19 DIAGNOSIS — E1136 Type 2 diabetes mellitus with diabetic cataract: Secondary | ICD-10-CM | POA: Insufficient documentation

## 2023-10-19 DIAGNOSIS — Z7984 Long term (current) use of oral hypoglycemic drugs: Secondary | ICD-10-CM | POA: Diagnosis not present

## 2023-10-19 HISTORY — DX: Other complications of anesthesia, initial encounter: T88.59XA

## 2023-10-19 HISTORY — PX: CATARACT EXTRACTION W/PHACO: SHX586

## 2023-10-19 HISTORY — DX: Other specified postprocedural states: R11.2

## 2023-10-19 LAB — GLUCOSE, CAPILLARY: Glucose-Capillary: 130 mg/dL — ABNORMAL HIGH (ref 70–99)

## 2023-10-19 SURGERY — PHACOEMULSIFICATION, CATARACT, WITH IOL INSERTION
Anesthesia: Topical | Site: Eye | Laterality: Right

## 2023-10-19 MED ORDER — LACTATED RINGERS IV SOLN: INTRAVENOUS | Status: DC

## 2023-10-19 MED ORDER — SIGHTPATH DOSE#1 BSS IO SOLN
INTRAOCULAR | Status: DC | PRN
  Administered 2023-10-19: 15 mL via INTRAOCULAR

## 2023-10-19 MED ORDER — MIDAZOLAM HCL 2 MG/2ML IJ SOLN
INTRAMUSCULAR | Status: AC
Start: 2023-10-19 — End: 2023-10-19
  Filled 2023-10-19: qty 2

## 2023-10-19 MED ORDER — ARMC OPHTHALMIC DILATING DROPS
1.0000 | OPHTHALMIC | Status: DC | PRN
  Administered 2023-10-19 (×3): 1 via OPHTHALMIC

## 2023-10-19 MED ORDER — MOXIFLOXACIN HCL 0.5 % OP SOLN
OPHTHALMIC | Status: DC | PRN
  Administered 2023-10-19: .2 mL via OPHTHALMIC

## 2023-10-19 MED ORDER — TETRACAINE HCL 0.5 % OP SOLN
1.0000 [drp] | OPHTHALMIC | Status: DC | PRN
  Administered 2023-10-19 (×3): 1 [drp] via OPHTHALMIC

## 2023-10-19 MED ORDER — FENTANYL CITRATE (PF) 100 MCG/2ML IJ SOLN
INTRAMUSCULAR | Status: AC
Start: 2023-10-19 — End: 2023-10-19
  Filled 2023-10-19: qty 2

## 2023-10-19 MED ORDER — FENTANYL CITRATE (PF) 100 MCG/2ML IJ SOLN
INTRAMUSCULAR | Status: DC | PRN
  Administered 2023-10-19: 50 ug via INTRAVENOUS

## 2023-10-19 MED ORDER — LIDOCAINE HCL (PF) 2 % IJ SOLN
INTRAOCULAR | Status: DC | PRN
  Administered 2023-10-19: 1 mL via INTRAOCULAR

## 2023-10-19 MED ORDER — MIDAZOLAM HCL 2 MG/2ML IJ SOLN
INTRAMUSCULAR | Status: DC | PRN
  Administered 2023-10-19 (×2): 1 mg via INTRAVENOUS

## 2023-10-19 MED ORDER — SIGHTPATH DOSE#1 BSS IO SOLN
INTRAOCULAR | Status: DC | PRN
  Administered 2023-10-19: 73 mL via OPHTHALMIC

## 2023-10-19 MED ORDER — SIGHTPATH DOSE#1 NA HYALUR & NA CHOND-NA HYALUR IO KIT
PACK | INTRAOCULAR | Status: DC | PRN
  Administered 2023-10-19: 1 via OPHTHALMIC

## 2023-10-19 MED ORDER — TETRACAINE HCL 0.5 % OP SOLN
OPHTHALMIC | Status: AC
Start: 2023-10-19 — End: 2023-10-19
  Filled 2023-10-19: qty 4

## 2023-10-19 MED ORDER — ARMC OPHTHALMIC DILATING DROPS
OPHTHALMIC | Status: AC
  Filled 2023-10-19: qty 0.5

## 2023-10-19 SURGICAL SUPPLY — 10 items
CATARACT SUITE SIGHTPATH (MISCELLANEOUS) ×1 IMPLANT
DISSECTOR HYDRO NUCLEUS 50X22 (MISCELLANEOUS) ×1 IMPLANT
FEE CATARACT SUITE SIGHTPATH (MISCELLANEOUS) ×1 IMPLANT
GLOVE PI ULTRA LF STRL 7.5 (GLOVE) ×1 IMPLANT
GLOVE SURG SYN 6.5 PF PI BL (GLOVE) ×1 IMPLANT
GLOVE SURG SYN 8.5 PF PI BL (GLOVE) ×1 IMPLANT
LENS IOL TECNIS EYHANCE 20.5 (Intraocular Lens) IMPLANT
NDL FILTER BLUNT 18X1 1/2 (NEEDLE) ×1 IMPLANT
NEEDLE FILTER BLUNT 18X1 1/2 (NEEDLE) ×1 IMPLANT
SYR 3ML LL SCALE MARK (SYRINGE) ×1 IMPLANT

## 2023-10-19 NOTE — H&P (Signed)
 [facility]   Primary Care Physician:  [facility] [place] Primary Ophthalmologist: Dr. Adine Novak  Pre-Procedure History & Physical: HPI:  Carol Tran is a 73 y.o. female here for cataract surgery.   Past Medical History:  Diagnosis Date   Colon cancer (HCC)    Complication of anesthesia    Diabetes mellitus without complication (HCC)    Hypertension    PONV (postoperative nausea and vomiting)     Past Surgical History:  Procedure Laterality Date   APPENDECTOMY     CESAREAN SECTION     TOTAL HIP REVISION     Right    Prior to Admission medications   Medication Sig Start Date End Date Taking? Authorizing Provider  diphenhydrAMINE  (BENADRYL ) 25 mg capsule Take 1 capsule (25 mg total) by mouth every 8 (eight) hours as needed for itching. 11/26/15  Yes Betancourt, Ellouise DELENA, NP  glipiZIDE (GLUCOTROL XL) 5 MG 24 hr tablet Take 5 mg by mouth daily with breakfast.   Yes [provider]  ipratropium (ATROVENT ) 0.06 % nasal spray Place 2 sprays into both nostrils 4 (four) times daily. 08/25/23  Yes Arvis Huxley B, PA-C  lidocaine  (XYLOCAINE ) 2 % solution Use as directed 15 mLs in the mouth or throat every 3 (three) hours as needed for mouth pain (swish and spit). 08/25/23  Yes Arvis Huxley NOVAK, PA-C  metFORMIN (GLUCOPHAGE) 500 MG tablet Take 500 mg by mouth 2 (two) times daily with a meal.   Yes [provider]  metoprolol succinate (TOPROL-XL) 25 MG 24 hr tablet Take 25 mg by mouth daily.   Yes [provider]  Multiple Vitamin (MULTIVITAMIN) tablet Take 1 tablet by mouth daily.   Yes [provider]  simvastatin (ZOCOR) 10 MG tablet Take 1 tablet by mouth every other day. 02/15/20  Yes [provider]  telmisartan (MICARDIS) 80 MG tablet Take 80 mg by mouth daily. 03/27/20  Yes [provider]  mupirocin  ointment (BACTROBAN ) 2 % Apply 1 application topically 2 (two) times daily. X 5-7 days 05/31/20   Rodriguez-Southworth, Sylvia, PA-C   promethazine -dextromethorphan (PROMETHAZINE -DM) 6.25-15 MG/5ML syrup Take 5 mLs by mouth 4 (four) times daily as needed. 08/25/23   Arvis Huxley NOVAK, PA-C  hydrochlorothiazide (HYDRODIURIL) 50 MG tablet Take 50 mg by mouth daily.  05/31/20  [provider]  omega-3 acid ethyl esters (LOVAZA) 1 G capsule Take 1 g by mouth 2 (two) times daily.  05/31/20  [provider]    Allergies as of 07/23/2023 - Review Complete 05/31/2020  Allergen Reaction Noted   Enoxaparin Hives, Itching, and Rash 11/19/2011   Penicillins Swelling and Other (See Comments) 11/21/2014   Doxycycline Diarrhea 10/17/2019   Other  02/08/2016   Latex Hives and Rash 02/04/2012    Family History  Problem Relation Age of Onset   Stroke Mother    Diabetes Mother    Prostate cancer Father     Social History   Socioeconomic History   Marital status: Widowed    Spouse name: Not on file   Number of children: Not on file   Years of education: Not on file   Highest education level: Not on file  Occupational History   Not on file  Tobacco Use   Smoking status: Never   Smokeless tobacco: Never  Vaping Use   Vaping status: Never Used  Substance and Sexual Activity   Alcohol use: No   Drug use: No   Sexual activity: Not on file  Other Topics Concern   Not on file  Social History Narrative   Not on file   Social Drivers of Health   Financial Resource Strain: Low Risk  (04/10/2021)   Received from [facility]   Overall Financial Resource Strain (CARDIA)    Difficulty of Paying Living Expenses: Not hard at all  Food Insecurity: No Food Insecurity (04/10/2021)   Received from [facility]   Hunger Vital Sign    Within the past 12 months, you worried that your food would run out before you got the money to buy more.: Never true    Within the past 12 months, the food you bought just didn't last and you didn't have money to get more.: Never true  Transportation Needs: No Transportation Needs  (04/10/2021)   Received from [facility]   PRAPARE - Transportation    Lack of Transportation (Medical): No    Lack of Transportation (Non-Medical): No  Physical Activity: Not on file  Stress: Not on file  Social Connections: Not on file  Intimate Partner Violence: Not At Risk (09/10/2021)   Received from [facility]   Humiliation, Afraid, Rape, and Kick questionnaire    Within the last year, have you been afraid of your partner or ex-partner?: No    Within the last year, have you been humiliated or emotionally abused in other ways by your partner or ex-partner?: No    Within the last year, have you been kicked, hit, slapped, or otherwise physically hurt by your partner or ex-partner?: No    Within the last year, have you been raped or forced to have any kind of sexual activity by your partner or ex-partner?: No    Review of Systems: See HPI, otherwise negative ROS  Physical Exam: BP (!) 169/75   Pulse 65   Temp 97.6 F (36.4 C) (Tympanic)   Ht 5' 6 (1.676 m)   Wt 108.4 kg   SpO2 96%   BMI 38.58 kg/m  General:   Alert, cooperative. Head:  Normocephalic and atraumatic. Respiratory:  Normal work of breathing. Cardiovascular:  NAD  Impression/Plan: Carol Tran is here for cataract surgery.  Risks, benefits, limitations, and alternatives regarding cataract surgery have been reviewed with the patient.  Questions have been answered.  All parties agreeable.   Adine Novak, MD  10/19/2023, 7:19 AM

## 2023-10-19 NOTE — Anesthesia Postprocedure Evaluation (Signed)
 Anesthesia Post Note  Patient: Channing LABOR [place]  Procedure(s) Performed: PHACOEMULSIFICATION, CATARACT, WITH IOL INSERTION 2.50 00:22.8 (Right: Eye)  Patient location during evaluation: PACU Anesthesia Type: MAC Level of consciousness: awake and alert Pain management: pain level controlled Vital Signs Assessment: post-procedure vital signs reviewed and stable Respiratory status: spontaneous breathing, nonlabored ventilation, respiratory function stable and patient connected to nasal cannula oxygen Cardiovascular status: stable and blood pressure returned to baseline Postop Assessment: no apparent nausea or vomiting Anesthetic complications: no   No notable events documented.   Last Vitals:  Vitals:   10/19/23 0754 10/19/23 0801  BP: (!) 158/52 (!) 150/65  Pulse: (!) 53 (!) 52  Resp: 16 12  Temp: (!) 36.3 C (!) 36.3 C  SpO2: 95% 95%    Last Pain:  Vitals:   10/19/23 0801  TempSrc:   PainSc: 0-No pain                 Rhydian Baldi C Abubakar Crispo

## 2023-10-19 NOTE — Op Note (Signed)
 OPERATIVE NOTE  GIONNA POLAK [id number] 10/19/2023   PREOPERATIVE DIAGNOSIS:  Nuclear sclerotic cataract right eye.  H25.11   POSTOPERATIVE DIAGNOSIS:    Nuclear sclerotic cataract right eye.     PROCEDURE:  Phacoemusification with posterior chamber intraocular lens placement of the right eye   LENS:   Implant Name Type Inv. Item Serial No. Manufacturer Lot No. LRB No. Used Action  LENS IOL TECNIS EYHANCE 20.5 - [id number] Intraocular Lens LENS IOL TECNIS EYHANCE 20.5 [id number] [employer]  Right 1 Implanted       Procedure(s): PHACOEMULSIFICATION, CATARACT, WITH IOL INSERTION 2.50 00:22.8 (Right)  SURGEON:  Adine Novak, MD, MPH  ANESTHESIOLOGIST: Anesthesiologist: Ola Donny BROCKS, MD CRNA: Anice Melnick, CRNA   ANESTHESIA:  Topical with tetracaine  drops augmented with 1% preservative-free intracameral lidocaine .  ESTIMATED BLOOD LOSS: less than 1 mL.   COMPLICATIONS:  None.   DESCRIPTION OF PROCEDURE:  The patient was identified in the holding room and transported to the operating room and placed in the supine position under the operating microscope.  The right eye was identified as the operative eye and it was prepped and draped in the usual sterile ophthalmic fashion.   A 1.0 millimeter clear-corneal paracentesis was made at the 10:30 position. 0.5 ml of preservative-free 1% lidocaine  with epinephrine  was injected into the anterior chamber.  The anterior chamber was filled with viscoelastic.  A 2.4 millimeter keratome was used to make a near-clear corneal incision at the 8:00 position.  A curvilinear capsulorrhexis was made with a cystotome and capsulorrhexis forceps.  Balanced salt solution was used to hydrodissect and hydrodelineate the nucleus.   Phacoemulsification was then used in stop and chop fashion to remove the lens nucleus and epinucleus.  The remaining cortex was then removed using the irrigation and aspiration handpiece. Viscoelastic was then placed into the  capsular bag to distend it for lens placement.  A lens was then injected into the capsular bag.  The remaining viscoelastic was aspirated.   Wounds were hydrated with balanced salt solution.  The anterior chamber was inflated to a physiologic pressure with balanced salt solution.   Intracameral vigamox  0.1 mL undiluted was injected into the eye and a drop placed onto the ocular surface.  No wound leaks were noted.  The patient was taken to the recovery room in stable condition without complications of anesthesia or surgery  Adine Novak 10/19/2023, 7:52 AM

## 2023-10-19 NOTE — Transfer of Care (Signed)
 Immediate Anesthesia Transfer of Care Note  Patient: Carol Tran  Procedure(s) Performed: PHACOEMULSIFICATION, CATARACT, WITH IOL INSERTION 2.50 00:22.8 (Right: Eye)  Patient Location: PACU  Anesthesia Type: No value filed.  Level of Consciousness: awake, alert  and patient cooperative  Airway and Oxygen Therapy: Patient Spontanous Breathing and Patient connected to supplemental oxygen  Post-op Assessment: Post-op Vital signs reviewed, Patient's Cardiovascular Status Stable, Respiratory Function Stable, Patent Airway and No signs of Nausea or vomiting  Post-op Vital Signs: Reviewed and stable  Complications: No notable events documented.

## 2023-10-20 ENCOUNTER — Encounter: Payer: Self-pay | Admitting: Ophthalmology

## 2023-10-20 ENCOUNTER — Other Ambulatory Visit: Payer: Self-pay

## 2023-10-23 NOTE — Anesthesia Preprocedure Evaluation (Addendum)
 Anesthesia Evaluation  Patient identified by MRN, date of birth, ID band Patient awake    Reviewed: Allergy & Precautions, H&P , NPO status , Patient's Chart, lab work & pertinent test results  History of Anesthesia Complications (+) PONV and history of anesthetic complications  Airway Mallampati: III  TM Distance: >3 FB Neck ROM: Full    Dental no notable dental hx.    Pulmonary neg pulmonary ROS   Pulmonary exam normal breath sounds clear to auscultation       Cardiovascular hypertension, negative cardio ROS Normal cardiovascular exam Rhythm:Regular Rate:Normal     Neuro/Psych negative neurological ROS  negative psych ROS   GI/Hepatic negative GI ROS, Neg liver ROS,,,  Endo/Other  negative endocrine ROSdiabetes    Renal/GU negative Renal ROS  negative genitourinary   Musculoskeletal negative musculoskeletal ROS (+)    Abdominal   Peds negative pediatric ROS (+)  Hematology negative hematology ROS (+)   Anesthesia Other Findings Previous cataract surgery 10-19-23 Dr. Ola   HTN Diabetes mellitus PONV  Reproductive/Obstetrics negative OB ROS                              Anesthesia Physical Anesthesia Plan  ASA: 3  Anesthesia Plan: MAC   Post-op Pain Management:    Induction: Intravenous  PONV Risk Score and Plan:   Airway Management Planned: Natural Airway and Nasal Cannula  Additional Equipment:   Intra-op Plan:   Post-operative Plan:   Informed Consent: I have reviewed the patients History and Physical, chart, labs and discussed the procedure including the risks, benefits and alternatives for the proposed anesthesia with the patient or authorized representative who has indicated his/her understanding and acceptance.     Dental Advisory Given  Plan Discussed with: Anesthesiologist, CRNA and Surgeon  Anesthesia Plan Comments: (Patient consented for risks of  anesthesia including but not limited to:  - adverse reactions to medications - damage to eyes, teeth, lips or other oral mucosa - nerve damage due to positioning  - sore throat or hoarseness - Damage to heart, brain, nerves, lungs, other parts of body or loss of life  Patient voiced understanding and assent.)         Anesthesia Quick Evaluation

## 2023-10-28 NOTE — Discharge Instructions (Signed)

## 2023-11-02 ENCOUNTER — Encounter: Payer: Self-pay | Admitting: Ophthalmology

## 2023-11-02 ENCOUNTER — Ambulatory Visit: Payer: Self-pay | Admitting: Anesthesiology

## 2023-11-02 ENCOUNTER — Encounter
Admission: RE | Disposition: A | Discharge status: Home / Self Care | Payer: Self-pay | Source: Home / Self Care | Attending: Ophthalmology

## 2023-11-02 ENCOUNTER — Other Ambulatory Visit: Payer: Self-pay

## 2023-11-02 ENCOUNTER — Ambulatory Visit
Admission: RE | Admit: 2023-11-02 | Discharge: 2023-11-02 | Disposition: A | Discharge status: Home / Self Care | Attending: Ophthalmology | Admitting: Ophthalmology

## 2023-11-02 DIAGNOSIS — E1136 Type 2 diabetes mellitus with diabetic cataract: Secondary | ICD-10-CM | POA: Diagnosis not present

## 2023-11-02 DIAGNOSIS — I10 Essential (primary) hypertension: Secondary | ICD-10-CM | POA: Diagnosis not present

## 2023-11-02 DIAGNOSIS — H2512 Age-related nuclear cataract, left eye: Secondary | ICD-10-CM | POA: Insufficient documentation

## 2023-11-02 HISTORY — PX: CATARACT EXTRACTION W/PHACO: SHX586

## 2023-11-02 LAB — GLUCOSE, CAPILLARY: Glucose-Capillary: 123 mg/dL — ABNORMAL HIGH (ref 70–99)

## 2023-11-02 SURGERY — PHACOEMULSIFICATION, CATARACT, WITH IOL INSERTION
Anesthesia: Monitor Anesthesia Care | Site: Eye | Laterality: Left

## 2023-11-02 MED ORDER — ARMC OPHTHALMIC DILATING DROPS
1.0000 | OPHTHALMIC | Status: DC | PRN
  Administered 2023-11-02 (×6): 1 via OPHTHALMIC

## 2023-11-02 MED ORDER — MOXIFLOXACIN HCL 0.5 % OP SOLN
OPHTHALMIC | Status: DC | PRN
  Administered 2023-11-02 (×2): .2 mL via OPHTHALMIC

## 2023-11-02 MED ORDER — FENTANYL CITRATE (PF) 100 MCG/2ML IJ SOLN
INTRAMUSCULAR | Status: DC | PRN
  Administered 2023-11-02 (×2): 50 ug via INTRAVENOUS

## 2023-11-02 MED ORDER — LACTATED RINGERS IV SOLN: INTRAVENOUS | Status: DC

## 2023-11-02 MED ORDER — TETRACAINE HCL 0.5 % OP SOLN
1.0000 [drp] | OPHTHALMIC | Status: DC | PRN
  Administered 2023-11-02 (×6): 1 [drp] via OPHTHALMIC

## 2023-11-02 MED ORDER — FENTANYL CITRATE (PF) 100 MCG/2ML IJ SOLN
INTRAMUSCULAR | Status: AC
Start: 2023-11-02 — End: 2023-11-02
  Filled 2023-11-02: qty 2

## 2023-11-02 MED ORDER — SIGHTPATH DOSE#1 BSS IO SOLN
INTRAOCULAR | Status: DC | PRN
  Administered 2023-11-02 (×2): 94 mL via OPHTHALMIC

## 2023-11-02 MED ORDER — TETRACAINE HCL 0.5 % OP SOLN
OPHTHALMIC | Status: AC
  Filled 2023-11-02: qty 4

## 2023-11-02 MED ORDER — MIDAZOLAM HCL 2 MG/2ML IJ SOLN
INTRAMUSCULAR | Status: DC | PRN
  Administered 2023-11-02 (×4): 1 mg via INTRAVENOUS

## 2023-11-02 MED ORDER — SIGHTPATH DOSE#1 NA HYALUR & NA CHOND-NA HYALUR IO KIT
PACK | INTRAOCULAR | Status: DC | PRN
  Administered 2023-11-02 (×2): 1 via OPHTHALMIC

## 2023-11-02 MED ORDER — ONDANSETRON HCL 4 MG/2ML IJ SOLN
4.0000 mg | Freq: Once | INTRAMUSCULAR | Status: AC
  Administered 2023-11-02 (×2): 4 mg via INTRAVENOUS

## 2023-11-02 MED ORDER — ARMC OPHTHALMIC DILATING DROPS
OPHTHALMIC | Status: AC
  Filled 2023-11-02: qty 0.5

## 2023-11-02 MED ORDER — ONDANSETRON HCL 4 MG/2ML IJ SOLN
INTRAMUSCULAR | Status: AC
  Filled 2023-11-02: qty 2

## 2023-11-02 MED ORDER — LIDOCAINE HCL (PF) 2 % IJ SOLN
INTRAOCULAR | Status: DC | PRN
  Administered 2023-11-02 (×2): 4 mL via INTRAOCULAR

## 2023-11-02 MED ORDER — MIDAZOLAM HCL 2 MG/2ML IJ SOLN
INTRAMUSCULAR | Status: AC
  Filled 2023-11-02: qty 2

## 2023-11-02 MED ORDER — SIGHTPATH DOSE#1 BSS IO SOLN
INTRAOCULAR | Status: DC | PRN
  Administered 2023-11-02 (×2): 15 mL via INTRAOCULAR

## 2023-11-02 SURGICAL SUPPLY — 9 items
DISSECTOR HYDRO NUCLEUS 50X22 (MISCELLANEOUS) ×1 IMPLANT
FEE CATARACT SUITE SIGHTPATH (MISCELLANEOUS) ×1 IMPLANT
GLOVE PI ULTRA LF STRL 7.5 (GLOVE) ×1 IMPLANT
GLOVE SURG SYN 6.5 PF PI BL (GLOVE) ×1 IMPLANT
GLOVE SURG SYN 8.5 PF PI BL (GLOVE) ×1 IMPLANT
LENS IOL TECNIS EYHANCE 20.0 (Intraocular Lens) IMPLANT
NDL FILTER BLUNT 18X1 1/2 (NEEDLE) ×1 IMPLANT
NEEDLE FILTER BLUNT 18X1 1/2 (NEEDLE) ×1 IMPLANT
SYR 3ML LL SCALE MARK (SYRINGE) ×1 IMPLANT

## 2023-11-02 NOTE — Anesthesia Postprocedure Evaluation (Signed)
 Anesthesia Post Note  Patient: [name]  Procedure(s) Performed: PHACOEMULSIFICATION, CATARACT, WITH IOL INSERTION 3.95 00:33.7 (Left: Eye)  Patient location during evaluation: PACU Anesthesia Type: MAC Level of consciousness: awake and alert Pain management: pain level controlled Vital Signs Assessment: post-procedure vital signs reviewed and stable Respiratory status: spontaneous breathing, nonlabored ventilation, respiratory function stable and patient connected to nasal cannula oxygen Cardiovascular status: blood pressure returned to baseline and stable Postop Assessment: no apparent nausea or vomiting Anesthetic complications: no   No notable events documented.   Last Vitals:  Vitals:   11/02/23 0756 11/02/23 0757  BP: (!) 128/55   Pulse: (!) 56 (!) 48  Resp: 14 12  Temp:    SpO2: 94% 95%    Last Pain:  Vitals:   11/02/23 0756  TempSrc:   PainSc: 0-No pain                 Iyla Balzarini C Glorie Dowlen

## 2023-11-02 NOTE — Transfer of Care (Signed)
 Immediate Anesthesia Transfer of Care Note  Patient: Carol Tran  Procedure(s) Performed: PHACOEMULSIFICATION, CATARACT, WITH IOL INSERTION 3.95 00:33.7 (Left: Eye)  Patient Location: PACU  Anesthesia Type: MAC  Level of Consciousness: awake, alert  and patient cooperative  Airway and Oxygen Therapy: Patient Spontanous Breathing and Patient connected to supplemental oxygen  Post-op Assessment: Post-op Vital signs reviewed, Patient's Cardiovascular Status Stable, Respiratory Function Stable, Patent Airway and No signs of Nausea or vomiting  Post-op Vital Signs: Reviewed and stable  Complications: No notable events documented.

## 2023-11-02 NOTE — Op Note (Signed)
 OPERATIVE NOTE  Carol Tran [id number] 11/02/2023   PREOPERATIVE DIAGNOSIS:  Nuclear sclerotic cataract left eye.  H25.12   POSTOPERATIVE DIAGNOSIS:    Nuclear sclerotic cataract left eye.     PROCEDURE:  Phacoemusification with posterior chamber intraocular lens placement of the left eye   LENS:   Implant Name Type Inv. Item Serial No. Manufacturer Lot No. LRB No. Used Action  LENS IOL TECNIS EYHANCE 20.0 - [id number] Intraocular Lens LENS IOL TECNIS EYHANCE 20.0 [id number] [employer]  Left 1 Implanted      Procedure(s): PHACOEMULSIFICATION, CATARACT, WITH IOL INSERTION 3.95 00:33.7 (Left)  SURGEON:  Adine Novak, MD, MPH   ANESTHESIA:  Topical with tetracaine  drops augmented with 1% preservative-free intracameral lidocaine .  ESTIMATED BLOOD LOSS: <1 mL   COMPLICATIONS:  None.   DESCRIPTION OF PROCEDURE:  The patient was identified in the holding room and transported to the operating room and placed in the supine position under the operating microscope.  The left eye was identified as the operative eye and it was prepped and draped in the usual sterile ophthalmic fashion.   A 1.0 millimeter clear-corneal paracentesis was made at the 5:00 position. 0.5 ml of preservative-free 1% lidocaine  with epinephrine  was injected into the anterior chamber.  The anterior chamber was filled with viscoelastic.  A 2.4 millimeter keratome was used to make a near-clear corneal incision at the 2:00 position.  A curvilinear capsulorrhexis was made with a cystotome and capsulorrhexis forceps.  Balanced salt solution was used to hydrodissect and hydrodelineate the nucleus.   Phacoemulsification was then used in stop and chop fashion to remove the lens nucleus and epinucleus.  The remaining cortex was then removed using the irrigation and aspiration handpiece. Viscoelastic was then placed into the capsular bag to distend it for lens placement.  A lens was then injected into the capsular bag.  The  remaining viscoelastic was aspirated.   Wounds were hydrated with balanced salt solution.  The anterior chamber was inflated to a physiologic pressure with balanced salt solution.  Intracameral vigamox  0.1 mL undiltued was injected into the eye and a drop placed onto the ocular surface.  No wound leaks were noted.  The patient was taken to the recovery room in stable condition without complications of anesthesia or surgery  Adine Novak 11/02/2023, 7:51 AM

## 2023-11-02 NOTE — H&P (Signed)
 [facility]   Primary Care Physician:  Care, [place] Primary Ophthalmologist: Dr. Adine Novak  Pre-Procedure History & Physical: HPI:  Carol Tran is a 73 y.o. female here for cataract surgery.   Past Medical History:  Diagnosis Date   Colon cancer (HCC)    Complication of anesthesia    Diabetes mellitus without complication (HCC)    Hypertension    PONV (postoperative nausea and vomiting)     Past Surgical History:  Procedure Laterality Date   APPENDECTOMY     CATARACT EXTRACTION W/PHACO Right 10/19/2023   Procedure: PHACOEMULSIFICATION, CATARACT, WITH IOL INSERTION 2.50 00:22.8;  Surgeon: Novak Adine Anes, MD;  Location: [facility];  Service: Ophthalmology;  Laterality: Right;   CESAREAN SECTION     TOTAL HIP REVISION     Right    Prior to Admission medications   Medication Sig Start Date End Date Taking? Authorizing Provider  diphenhydrAMINE  (BENADRYL ) 25 mg capsule Take 1 capsule (25 mg total) by mouth every 8 (eight) hours as needed for itching. 11/26/15  Yes Betancourt, Ellouise DELENA, NP  glipiZIDE (GLUCOTROL XL) 5 MG 24 hr tablet Take 5 mg by mouth daily with breakfast.   Yes [provider]  lisinopril (ZESTRIL) 10 MG tablet Take 10 mg by mouth daily.   Yes [provider]  metFORMIN (GLUCOPHAGE) 500 MG tablet Take 500 mg by mouth 2 (two) times daily with a meal.   Yes [provider]  metoprolol succinate (TOPROL-XL) 25 MG 24 hr tablet Take 25 mg by mouth daily.   Yes [provider]  Multiple Vitamin (MULTIVITAMIN) tablet Take 1 tablet by mouth daily.   Yes [provider]  simvastatin (ZOCOR) 10 MG tablet Take 1 tablet by mouth every other day. 02/15/20  Yes [provider]  ipratropium (ATROVENT ) 0.06 % nasal spray Place 2 sprays into both nostrils 4 (four) times daily. Patient not taking: Reported on 11/02/2023 08/25/23   Arvis Jolan NOVAK, PA-C  lidocaine  (XYLOCAINE ) 2 % solution Use as directed 15 mLs in  the mouth or throat every 3 (three) hours as needed for mouth pain (swish and spit). Patient not taking: Reported on 11/02/2023 08/25/23   Arvis Jolan NOVAK, PA-C  mupirocin  ointment (BACTROBAN ) 2 % Apply 1 application topically 2 (two) times daily. X 5-7 days 05/31/20   Rodriguez-Southworth, Sylvia, PA-C  promethazine -dextromethorphan (PROMETHAZINE -DM) 6.25-15 MG/5ML syrup Take 5 mLs by mouth 4 (four) times daily as needed. 08/25/23   Arvis Jolan NOVAK, PA-C  telmisartan (MICARDIS) 80 MG tablet Take 80 mg by mouth daily. Patient not taking: Reported on 11/02/2023 03/27/20   [provider]  hydrochlorothiazide (HYDRODIURIL) 50 MG tablet Take 50 mg by mouth daily.  05/31/20  [provider]  omega-3 acid ethyl esters (LOVAZA) 1 G capsule Take 1 g by mouth 2 (two) times daily.  05/31/20  [provider]    Allergies as of 07/23/2023 - Review Complete 05/31/2020  Allergen Reaction Noted   Enoxaparin Hives, Itching, and Rash 11/19/2011   Penicillins Swelling and Other (See Comments) 11/21/2014   Doxycycline Diarrhea 10/17/2019   Other  02/08/2016   Latex Hives and Rash 02/04/2012    Family History  Problem Relation Age of Onset   Stroke Mother    Diabetes Mother    Prostate cancer Father     Social History   Socioeconomic History   Marital status: Widowed    Spouse name: Not on file   Number of children: Not on  file   Years of education: Not on file   Highest education level: Not on file  Occupational History   Not on file  Tobacco Use   Smoking status: Never   Smokeless tobacco: Never  Vaping Use   Vaping status: Never Used  Substance and Sexual Activity   Alcohol use: No   Drug use: No   Sexual activity: Not on file  Other Topics Concern   Not on file  Social History Narrative   Not on file   Social Drivers of Health   Financial Resource Strain: Low Risk  (10/21/2023)   [facility]   Overall Financial Resource Strain (CARDIA)    How  hard is it for you to pay for the very basics like food, housing, medical care, and heating?: Not hard at all  Food Insecurity: No Food Insecurity (10/21/2023)   Received from [facility]   Hunger Vital Sign    Within the past 12 months, you worried that your food would run out before you got the money to buy more.: Never true    Within the past 12 months, the food you bought just didn't last and you didn't have money to get more.: Never true  Transportation Needs: No Transportation Needs (10/21/2023)   Received from [facility]  - [facility]    Lack of Transportation (Medical): No    Lack of Transportation (Non-Medical): No  Physical Activity: Not on file  Stress: Not on file  Social Connections: Not on file  Intimate Partner Violence: Not At Risk (09/10/2021)   Received from [facility]   Humiliation, Afraid, Rape, and Kick questionnaire    Within the last year, have you been afraid of your partner or ex-partner?: No    Within the last year, have you been humiliated or emotionally abused in other ways by your partner or ex-partner?: No    Within the last year, have you been kicked, hit, slapped, or otherwise physically hurt by your partner or ex-partner?: No    Within the last year, have you been raped or forced to have any kind of sexual activity by your partner or ex-partner?: No    Review of Systems: See HPI, otherwise negative ROS  Physical Exam: BP (!) 180/66   Pulse 60   Temp 97.9 F (36.6 C) (Temporal)   Resp 12   Ht 5' 6 (1.676 m)   Wt 102.5 kg   SpO2 95%   BMI 36.48 kg/m  General:   Alert, cooperative. Head:  Normocephalic and atraumatic. Respiratory:  Normal work of breathing. Cardiovascular:  NAD  Impression/Plan: Carol Tran is here for cataract surgery.  Risks, benefits, limitations, and alternatives regarding cataract surgery have been reviewed with the patient.  Questions have been answered.  All parties agreeable.   Adine Novak, MD  11/02/2023, 7:17 AM
